# Patient Record
Sex: Male | Born: 1985 | Race: White | Hispanic: No | Marital: Married | Smoking: Never smoker
Health system: Southern US, Community
[De-identification: ages and names within clinical notes are randomized; demographics above are authoritative.]

## PROBLEM LIST (undated history)

## (undated) DIAGNOSIS — Z809 Family history of malignant neoplasm, unspecified: Secondary | ICD-10-CM

## (undated) DIAGNOSIS — J45909 Unspecified asthma, uncomplicated: Secondary | ICD-10-CM

## (undated) HISTORY — DX: Family history of malignant neoplasm, unspecified: Z80.9

## (undated) HISTORY — PX: OTHER SURGICAL HISTORY: SHX169

---

## 2005-10-23 ENCOUNTER — Emergency Department (HOSPITAL_COMMUNITY): Admission: EM | Admit: 2005-10-23 | Discharge: 2005-10-23 | Payer: Self-pay | Admitting: Emergency Medicine

## 2017-04-27 ENCOUNTER — Institutional Professional Consult (permissible substitution): Payer: Self-pay | Admitting: Pulmonary Disease

## 2017-04-27 NOTE — Progress Notes (Deleted)
   Synopsis: Referred in  for ***  Subjective:   PATIENT ID: Brian Werner GENDER: male DOB: 11-08-1985, MRN: [id number]   HPI  No chief complaint on file.   ***  No past medical history on file.   No family history on file.   Social History   Socioeconomic History  . Marital status: Single    Spouse name: Not on file  . Number of children: Not on file  . Years of education: Not on file  . Highest education level: Not on file  Social Needs  . Financial resource strain: Not on file  . Food insecurity - worry: Not on file  . Food insecurity - inability: Not on file  . Transportation needs - medical: Not on file  . Transportation needs - non-medical: Not on file  Occupational History  . Not on file  Tobacco Use  . Smoking status: Not on file  Substance and Sexual Activity  . Alcohol use: Not on file  . Drug use: Not on file  . Sexual activity: Not on file  Other Topics Concern  . Not on file  Social History Narrative  . Not on file     Allergies not on file   No outpatient medications prior to visit.   No facility-administered medications prior to visit.     ROS    Objective:  Physical Exam   There were no vitals filed for this visit.  ***  CBC No results found for: WBC, RBC, HGB, HCT, PLT, MCV, MCH, MCHC, RDW, LYMPHSABS, MONOABS, EOSABS, BASOSABS   Chest imaging:  PFT:  Labs:  Path:  Echo:  Heart Catheterization:       Assessment & Plan:   No diagnosis found.  Discussion: ***   No current outpatient medications on file.

## 2017-05-21 ENCOUNTER — Encounter: Payer: Self-pay | Admitting: Pulmonary Disease

## 2017-05-21 ENCOUNTER — Ambulatory Visit (HOSPITAL_BASED_OUTPATIENT_CLINIC_OR_DEPARTMENT_OTHER)
Admission: RE | Admit: 2017-05-21 | Discharge: 2017-05-21 | Disposition: A | Discharge status: Home / Self Care | Payer: Non-veteran care | Source: Ambulatory Visit | Attending: Pulmonary Disease | Admitting: Pulmonary Disease

## 2017-05-21 ENCOUNTER — Ambulatory Visit (INDEPENDENT_AMBULATORY_CARE_PROVIDER_SITE_OTHER): Payer: Non-veteran care | Admitting: Pulmonary Disease

## 2017-05-21 VITALS — BP 124/82 | HR 80 | Ht 72.0 in | Wt 175.0 lb

## 2017-05-21 DIAGNOSIS — J4550 Severe persistent asthma, uncomplicated: Secondary | ICD-10-CM

## 2017-05-21 MED ORDER — FORMOTEROL FUMARATE 12 MCG IN CAPS: 12.0000 ug | ORAL_CAPSULE | Freq: Two times a day (BID) | RESPIRATORY_TRACT | 12 refills | Status: DC

## 2017-05-21 MED ORDER — FORMOTEROL FUMARATE 12 MCG IN CAPS: 12.0000 ug | ORAL_CAPSULE | Freq: Two times a day (BID) | RESPIRATORY_TRACT | 2 refills | Status: DC

## 2017-05-21 MED ORDER — FLUTICASONE PROPIONATE HFA 44 MCG/ACT IN AERO: 2.0000 | INHALATION_SPRAY | Freq: Two times a day (BID) | RESPIRATORY_TRACT | 12 refills | Status: DC

## 2017-05-21 MED ORDER — FLUTICASONE PROPIONATE HFA 44 MCG/ACT IN AERO: 2.0000 | INHALATION_SPRAY | Freq: Two times a day (BID) | RESPIRATORY_TRACT | 2 refills | Status: DC

## 2017-05-21 NOTE — Progress Notes (Deleted)
   Synopsis: Referred in  for ***  Subjective:   PATIENT ID: Brian Werner GENDER: male DOB: 02/25/1986, MRN: [id number]   HPI  No chief complaint on file.   ***  No past medical history on file.   No family history on file.   Social History   Socioeconomic History  . Marital status: Single    Spouse name: Not on file  . Number of children: Not on file  . Years of education: Not on file  . Highest education level: Not on file  Social Needs  . Financial resource strain: Not on file  . Food insecurity - worry: Not on file  . Food insecurity - inability: Not on file  . Transportation needs - medical: Not on file  . Transportation needs - non-medical: Not on file  Occupational History  . Not on file  Tobacco Use  . Smoking status: Not on file  Substance and Sexual Activity  . Alcohol use: Not on file  . Drug use: Not on file  . Sexual activity: Not on file  Other Topics Concern  . Not on file  Social History Narrative  . Not on file     Allergies not on file   No outpatient medications prior to visit.   No facility-administered medications prior to visit.     ROS    Objective:  Physical Exam   There were no vitals filed for this visit.  ***  CBC No results found for: WBC, RBC, HGB, HCT, PLT, MCV, MCH, MCHC, RDW, LYMPHSABS, MONOABS, EOSABS, BASOSABS   Chest imaging:  PFT:  Labs:  Path:  Echo:  Heart Catheterization:       Assessment & Plan:   No diagnosis found.  Discussion: ***   No current outpatient medications on file.  

## 2017-05-21 NOTE — Progress Notes (Signed)
Subjective:    Patient ID: Brian Werner, male    DOB: 29-Sep-1985, 31 y.o.   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  HPI Chief Complaint  Patient presents with  . pulm consult    [facility] [place].   Brian Werner developed respiratory symptoms and was diagnosed with asthma about 2-3 years ago.  He has never smoked cigarettes.  He never had childhood asthma.  He was diagnosed while in the military deployed in [place] but his symptoms [place] [address] [place]  He was hospitalized 3-4 times requiring high doses of steroids.  Prior to his hospitalization he had been managed by [facility] with various forms of Advair, Flovent, and other inhaled corticosteroids but still had persistent symptoms.  In fact his symptoms worsen to the point to where he needed frequent hospitalizations in [place].  Finally, he was prescribed a [place] combination inhaler of formoterol and fluticasone.   He takes the [place] combination inhaler 2-4 puffs per day and he feels well.  He may have chest tightness, wheezing and mild dyspnea if there dust in the environment, or if his seasonal allergies are worse, but in general his symptoms are very well controlled while taking this medicine.  [place] he has tried to change to Symbicort and he had recurrence of symptoms.  If he has a cold he feels that his symptoms.  On a good day he feels that he has no symptoms.    He has a He keeps an albuterol inhaler but only really needs it if he if he doesn't have the combination inhaler from [place] available to him.  He says that he was given a very large supply when he left [place] and now he is nearing the end of that supply.  While taking the medicine he may need to use albuterol 1-2 to specifically he will use it for chest tightness wheezing or shortness of breath  He  will have some associated itchy eyes times.  But no sinus congestion or postnasal drip.   No GERD symptoms.  He doesn't smoke and he has never smoked.  He works in a Restaurant manager, fast foodclerical environment.  Home is older, but doesn't have any mold, mildew or dust that he is aware of.  They have two dogs outside.   Exercise goes OK as long as he has the medicines.   History reviewed. No pertinent past medical history.   Family History  Problem Relation Age of Onset  . Diabetes Father      Social History   Socioeconomic History  . Marital status: Single    Spouse name: Not on file  . Number of children: Not on file  . Years of education: Not on file  . Highest education level: Not on file  Social Needs  . Financial resource strain: Not on file  . Food insecurity - worry: Not on file  . Food insecurity - inability: Not on file  . Transportation needs - medical: Not on file  . Transportation needs - non-medical: Not on file  Occupational History  . Not on file  Tobacco Use  . Smoking status: Never Smoker  . Smokeless tobacco: Never Used  Substance and Sexual Activity  . Alcohol use: No    Frequency: Never  . Drug use: No  . Sexual activity: Not on  file  Other Topics Concern  . Not on file  Social History Narrative  . Not on file     Not on File   Outpatient Medications Prior to Visit  Medication Sig Dispense Refill  . montelukast (SINGULAIR) 10 MG tablet Take 10 mg by mouth at bedtime.     No facility-administered medications prior to visit.       Review of Systems  Constitutional: Negative for fever and unexpected weight change.  HENT: Positive for sinus pressure and sneezing. Negative for congestion, dental problem, ear pain, nosebleeds, postnasal drip, rhinorrhea, sore throat and trouble swallowing.   Eyes: Negative for redness and itching.  Respiratory: Positive for cough, chest tightness, shortness of breath and wheezing.   Cardiovascular: Negative for palpitations and  leg swelling.  Gastrointestinal: Negative for nausea and vomiting.  Genitourinary: Negative for dysuria.  Musculoskeletal: Negative for joint swelling.  Skin: Negative for rash.  Allergic/Immunologic: Positive for environmental allergies. Negative for food allergies and immunocompromised state.  Neurological: Negative for headaches.  Hematological: Does not bruise/bleed easily.  Psychiatric/Behavioral: Negative for dysphoric mood. The patient is not nervous/anxious.        Objective:   Physical Exam  Vitals:   05/21/17 1408  BP: 124/82  Pulse: 80  SpO2: 97%  Weight: 175 lb (79.4 kg)  Height: 6' (1.829 m)   RA  Gen: well appearing, no acute distress HENT: NCAT, OP clear, neck supple without masses Eyes: PERRL, EOMi Lymph: no cervical lymphadenopathy PULM: CTA B CV: RRR, no mgr, no JVD GI: BS+, soft, nontender, no hsm Derm: no rash or skin breakdown MSK: normal bulk and tone Neuro: A&Ox4, CN II-XII intact, strength 5/5 in all 4 extremities Psyche: normal mood and affect       Assessment & Plan:   Severe persistent asthma without complication  Discussion: Brian Werner has severe persistent asthma and has been hospitalized for the same in the past.  The only medication combination that has worked for him has been inhaled fluticasone and formoterol.  He has tried Advair in various forms and other medications including Symbicort and none of these have given him good control.  At this time we need to continue formoterol and fluticasone but the only way to do that is to use separate inhalers.  I told him at length today that he cannot take the formoterol independently because of its associated increased risk of death and asthmatics.  As long as he is taking it with an inhaled corticosteroid (fluticasone) however it should be fine.  Plan:  Severe persistent asthma: We will prescribe Foradil take this twice a day, only if you are able to take an inhaled corticosteroid like  fluticasone Take inhaled fluticasone 2 puffs twice per day Call me if your symptoms are worsening while taking these medicines We will get a chest x-ray We will get a blood test called a CBC to look at your serum eosinophil count We will check a blood test called a serum IgE because this test is associated with allergic asthma We will check a spirometry test today  We will plan on seeing him back in 3-4 months or sooner if needed  > 1 hour spent on this visit.   Current Outpatient Medications:  .  montelukast (SINGULAIR) 10 MG tablet, Take 10 mg by mouth at bedtime., Disp: , Rfl:

## 2017-05-21 NOTE — Patient Instructions (Addendum)
Severe persistent asthma: We will prescribe Foradil take this twice a day, only if you are able to take an inhaled corticosteroid like fluticasone Take inhaled fluticasone 2 puffs twice per day Call me if your symptoms are worsening while taking these medicines We will get a chest x-ray We will get a blood test called a CBC to look at your serum eosinophil count We will check a blood test called a serum IgE because this test is associated with allergic asthma We will check a spirometry test today  We will plan on seeing him back in 3-4 months or sooner if needed

## 2017-05-24 LAB — CBC WITH DIFFERENTIAL
BASOS ABS: 0.1 10*3/uL (ref 0.0–0.2)
Basos: 1 %
EOS (ABSOLUTE): 0.2 10*3/uL (ref 0.0–0.4)
Eos: 3 %
Hematocrit: 48.4 % (ref 37.5–51.0)
Hemoglobin: 16.6 g/dL (ref 13.0–17.7)
IMMATURE GRANS (ABS): 0 10*3/uL (ref 0.0–0.1)
Immature Granulocytes: 0 %
LYMPHS: 30 %
Lymphocytes Absolute: 2 10*3/uL (ref 0.7–3.1)
MCH: 29.6 pg (ref 26.6–33.0)
MCHC: 34.3 g/dL (ref 31.5–35.7)
MCV: 86 fL (ref 79–97)
MONOS ABS: 0.5 10*3/uL (ref 0.1–0.9)
Monocytes: 7 %
NEUTROS PCT: 59 %
Neutrophils Absolute: 3.8 10*3/uL (ref 1.4–7.0)
RBC: 5.6 x10E6/uL (ref 4.14–5.80)
RDW: 13.5 % (ref 12.3–15.4)
WBC: 6.5 10*3/uL (ref 3.4–10.8)

## 2017-05-24 LAB — IGE: IGE (IMMUNOGLOBULIN E), SERUM: 45 [IU]/mL (ref 0–100)

## 2017-05-28 ENCOUNTER — Telehealth: Payer: Self-pay | Admitting: Pulmonary Disease

## 2017-05-28 MED ORDER — FORMOTEROL FUMARATE 12 MCG IN CAPS: 12.0000 ug | ORAL_CAPSULE | Freq: Two times a day (BID) | RESPIRATORY_TRACT | 2 refills | Status: DC

## 2017-05-28 MED ORDER — FLUTICASONE PROPIONATE HFA 44 MCG/ACT IN AERO: 2.0000 | INHALATION_SPRAY | Freq: Two times a day (BID) | RESPIRATORY_TRACT | 2 refills | Status: DC

## 2017-05-28 MED ORDER — MONTELUKAST SODIUM 10 MG PO TABS: 10.0000 mg | ORAL_TABLET | Freq: Every day | ORAL | 2 refills | Status: AC

## 2017-05-28 MED ORDER — MONTELUKAST SODIUM 10 MG PO TABS: 10.0000 mg | ORAL_TABLET | Freq: Every day | ORAL | 2 refills | Status: DC

## 2017-05-28 MED ORDER — FORMOTEROL FUMARATE 12 MCG IN CAPS
12.0000 ug | ORAL_CAPSULE | Freq: Two times a day (BID) | RESPIRATORY_TRACT | 2 refills | Status: DC
Start: 2017-05-28 — End: 2017-05-28

## 2017-05-28 NOTE — Telephone Encounter (Signed)
Spoke with pt, he states [facility] [place]. I re-sent the Rxs to the pharmacy. Nothing further is needed.   Fax [phone]

## 2017-05-29 ENCOUNTER — Telehealth: Payer: Self-pay | Admitting: Pulmonary Disease

## 2017-05-29 NOTE — Telephone Encounter (Signed)
Called spoke with patient, apologized for the inconvenience Per the 12.27.18 phone note, Delaney Meigsamara [place] [facility] [place] [facility] Asked pt to verify fax number > he stated that he does not have the fax number and has been trying to obtain it all day [facility] the [facility] > was #[phone] in line on hold with the [facility]  Was able to locate the same Rx's Delaney Meigsamara faxed yesterday  Pt will come to the office to pick these up on Monday 12.31.18 Rx's placed in envelope and put in brown accordian folder up front  Nothing further needed at this time; will sign off

## 2017-06-03 NOTE — Telephone Encounter (Signed)
[facility] Prescriptions saved in Dr. Kirstie MirzaNadels paper box

## 2017-06-03 NOTE — Telephone Encounter (Signed)
Brian MerlLisa Werner Fax#[phone]

## 2017-06-05 ENCOUNTER — Telehealth: Payer: Self-pay | Admitting: Pulmonary Disease

## 2017-06-05 NOTE — Telephone Encounter (Signed)
         I tried to call Misty StanleyLisa but the [facility] stated I would have to have her last name in order to locate her since they have many clinics. I tried the pharmacy, and there was no Misty StanleyLisa. I called pt to see if he knew what she was needing so we can help. He did receive a e-mail stating the [facility] cannot provide the Foradil any longer and she suggested pt get an RX for [facility] instead of the two separate inhalers. Fluticasone and formoterol has always worked for him and wants BQ opinion on the [facility].  He states if it is not an option, he would have to call the [facility] or have us write a letter stating this is what he needs. Please advise.

## 2017-06-05 NOTE — Telephone Encounter (Signed)
He absolutely has to be on formoterol and inhaled fluticasone.  So we will need to appeal.  He has a long history of failing multiple other inhalers.

## 2017-06-08 NOTE — Telephone Encounter (Signed)
Left message for patient

## 2017-06-08 NOTE — Telephone Encounter (Signed)
[facility], states that it is not a matter of appealing a noncovered inhaler, the problem is that the [facility] no longer provides Foradil for veterans.    BQ please advise if an alternative to the foradil can be suggested, or if we need to try and find a pharmacy that will cover the foradil for pt.  Thanks!

## 2017-06-08 NOTE — Telephone Encounter (Signed)
He will have to get it from another pharmacy then.

## 2017-06-09 NOTE — Telephone Encounter (Signed)
Spoke with pt to make aware of recs. States that he does not want to get a med from another pharmacy because he will have to pay for it if he does.  Pt states that he will talk to Gould[facility]isa at the [facility] and see if this will be covered at another pharmacy since the [facility] is not providing the Foradil, and will call us back if he wants it sent to another pharmacy.  Will await call back.

## 2017-06-09 NOTE — Telephone Encounter (Signed)
Spoke with patient Pt stated he needs hard copies of both inhaler prescriptions and singular prescription individual sheets of paper Pt stated needs a letter from BQ stating these medications work for the patient other medications failed  Pt advised once he receives these scripts on separate sheets of paper and letter from BQ, the VA would be able to provide these medications for the pt from other pharmacy but through the [facility] therefore pt will not have to pay.  BQ please advise

## 2017-06-09 NOTE — Telephone Encounter (Signed)
Pt is calling back [phone]

## 2017-06-10 NOTE — Telephone Encounter (Signed)
OK  Please indicate the following in the letter:  Brian Werner has severe persistent asthma and has been hospitalized for the same in the past.  The only medication combination that has worked for him has been inhaled fluticasone and formoterol.  He has tried Advair in various forms and other medications including Symbicort and none of these have given him good control.  At this time we need to continue formoterol and fluticasone but the only way to do that is to use separate inhalers.

## 2017-06-10 NOTE — Telephone Encounter (Signed)
Will go ahead and type letter. Attempted to call patient to make him aware.

## 2017-06-12 NOTE — Telephone Encounter (Signed)
LMTCB X2

## 2017-06-15 NOTE — Telephone Encounter (Signed)
Attempted to contact pt. There was no answer and I could not leave a message due to his voicemail being full. Will try back. 

## 2017-06-17 NOTE — Telephone Encounter (Signed)
lmtcb

## 2017-06-17 NOTE — Telephone Encounter (Signed)
ATC pt, no answer. Left message for pt to call back.  

## 2017-06-17 NOTE — Telephone Encounter (Signed)
Pt is calling back [phone] 

## 2017-06-18 NOTE — Telephone Encounter (Signed)
lmtcb x2 for pt. 

## 2017-06-19 NOTE — Telephone Encounter (Signed)
lmtcb X3 for pt. 

## 2017-06-22 NOTE — Telephone Encounter (Signed)
Will close this message since the patient has not returned our call.

## 2017-06-25 ENCOUNTER — Telehealth: Payer: Self-pay | Admitting: Pulmonary Disease

## 2017-06-25 NOTE — Telephone Encounter (Signed)
Pt stated needs a letter from BQ stating these medications work for the patient other medications failed  Pt is wanting to see if BQ could do this letter for him and he will come by and pick this up as he can take [facility] to [facility].  BQ please advise. Thanks

## 2017-06-26 NOTE — Telephone Encounter (Signed)
I'm happy to do this, but I am pretty sure we did it a month ago. If not then let me know and I can dictate again.  Will cc Morrie SheldonAshley.

## 2017-06-26 NOTE — Telephone Encounter (Signed)
Left message for patient to call back  

## 2017-06-29 NOTE — Telephone Encounter (Signed)
Per BQ from phone note from 06/05/2017:  OK  Please indicate the following in the letter:  Brian Werner has severe persistent asthma and has been hospitalized for the same in the past. The only medication combination that has worked for him has been inhaled fluticasone and formoterol. He has tried Advair in various forms and other medications including Symbicort and none of these have given him good control. At this time we need to continue formoterol and fluticasone but the only way to do that is to use separate inhalers.    -------------------------------------   Called and spoke to pt. Informed him that BQ wrote letter. Pt states he was able to resolve the issue without letter. Pt states he saw another [facility] doctor and informed her of the issue and she was able to call the pharmacy and prescribe what the inhalers the pt needed. However, pt states they currently do not carry the formoterol inhaler at this time so they provided him with the nebulizer for the next 2 months until they can provide the pt with the formoterol inhaler. Pt denied needing anything further. Will sign off.

## 2017-09-04 ENCOUNTER — Encounter: Payer: Self-pay | Admitting: Pulmonary Disease

## 2017-09-04 ENCOUNTER — Ambulatory Visit (INDEPENDENT_AMBULATORY_CARE_PROVIDER_SITE_OTHER): Payer: Non-veteran care | Admitting: Pulmonary Disease

## 2017-09-04 VITALS — BP 128/74 | HR 76 | Ht 72.0 in | Wt 173.6 lb

## 2017-09-04 DIAGNOSIS — J4550 Severe persistent asthma, uncomplicated: Secondary | ICD-10-CM

## 2017-09-04 NOTE — Progress Notes (Signed)
Subjective:    Patient ID: Brian Werner, male    DOB: [birth year], 32 y.o.   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  His symptoms have been well controlled on formoterol and fluticasone.   HPI Chief Complaint  Patient presents with  . Follow-up    pt states he is doing well, is no longer on foradil but has been switched to perforomist neb by [place].    Brian Werner has been using his formoterol nebulized twice a day and Flovent twice a day which he gets from the [place].  He says that he now has new insurance. He has needed to use his albuterol inhaler once or twice, once when he was cleaning out his basement garage.  He said that once back in January he had some difficulty and needed a prednisone taper.     PMH: Asthma  Family History  Problem Relation Age of Onset  . Diabetes Father         Review of Systems  Constitutional: Negative for fever and unexpected weight change.  HENT: Positive for sinus pressure and sneezing. Negative for congestion, dental problem, ear pain, nosebleeds, postnasal drip, rhinorrhea, sore throat and trouble swallowing.   Eyes: Negative for redness and itching.  Respiratory: Positive for cough, chest tightness, shortness of breath and wheezing.   Cardiovascular: Negative for palpitations and leg swelling.  Gastrointestinal: Negative for nausea and vomiting.  Genitourinary: Negative for dysuria.  Musculoskeletal: Negative for joint swelling.  Skin: Negative for rash.  Allergic/Immunologic: Positive for environmental allergies. Negative for food allergies and immunocompromised state.  Neurological: Negative for headaches.  Hematological: Does not bruise/bleed easily.  Psychiatric/Behavioral: Negative for dysphoric mood. The patient is not nervous/anxious.        Objective:   Physical Exam  Vitals:   09/04/17 0917  BP: 128/74  Pulse: 76  SpO2: 99%  Weight:  173 lb 9.6 oz (78.7 kg)  Height: 6' (1.829 m)   RA  Gen: well appearing HENT: OP clear, TM's clear, neck supple PULM: CTA B, normal percussion CV: RRR, no mgr, trace edema GI: BS+, soft, nontender Derm: no cyanosis or rash Psyche: normal mood and affect        Assessment & Plan:   Severe persistent asthma without complication  Discussion: This is been a stable interval for Brian Werner.  He only had one exacerbation in January when he was without formoterol.  He has a clinical syndrome consistent with asthma though not an atopic phenotype.  Unfortunately we were only able to use standard asthma controller medicines for patients in this scenario.  He has proven time after time that using any combination other than fluticasone and formoterol does not work for him and he should never be changed off of this combination.  I have encouraged him to explore options from international pharmacies for this combination as we know there are simpler options available to him rather than the nebulized Perforomist he is using now with HFA Flovent.  As always, we stressed the importance of using these 2 medicines together and never using formoterol alone.  He voiced understanding.  Plan: Severe persistent asthma: Continue using formoterol nebulized and Flovent Flovent must be used when using for Medrol Use albuterol as needed for chest tightness wheezing or shortness of breath From my standpoint it is okay to get this combination of medicines in a combination inhaler from international sources and  I am happy to help you with that  I will plan on seeing you back in 1 year or sooner if needed  19 minutes spent with patient, 25-minute visit   Current Outpatient Medications:  .  albuterol (PROVENTIL HFA;VENTOLIN HFA) 108 (90 Base) MCG/ACT inhaler, Inhale 2 puffs into the lungs every 6 (six) hours as needed for wheezing or shortness of breath., Disp: , Rfl:  .  fluticasone (FLOVENT HFA) 44 MCG/ACT inhaler,  Inhale 2 puffs into the lungs 2 (two) times daily., Disp: 1 Inhaler, Rfl: 2 .  formoterol (PERFOROMIST) 20 MCG/2ML nebulizer solution, Take 20 mcg by nebulization 2 (two) times daily., Disp: , Rfl:  .  montelukast (SINGULAIR) 10 MG tablet, Take 1 tablet (10 mg total) by mouth at bedtime., Disp: 30 tablet, Rfl: 2 .  formoterol (FORADIL) 12 MCG capsule for inhaler, Place 1 capsule (12 mcg total) into inhaler and inhale 2 (two) times daily. (Patient not taking: Reported on 09/04/2017), Disp: 60 capsule, Rfl: 2

## 2017-09-04 NOTE — Patient Instructions (Signed)
Severe persistent asthma: Continue using formoterol nebulized and Flovent Flovent must be used when using for Medrol Use albuterol as needed for chest tightness wheezing or shortness of breath From my standpoint it is okay to get this combination of medicines in a combination inhaler from international sources and I am happy to help you with that  I will plan on seeing you back in 1 year or sooner if needed  19 minutes spent with patient, 25-minute visit

## 2017-12-18 ENCOUNTER — Encounter (HOSPITAL_COMMUNITY): Payer: Self-pay | Admitting: Obstetrics and Gynecology

## 2017-12-18 ENCOUNTER — Other Ambulatory Visit: Payer: Self-pay

## 2017-12-18 ENCOUNTER — Emergency Department (HOSPITAL_COMMUNITY)
Admission: EM | Admit: 2017-12-18 | Discharge: 2017-12-18 | Disposition: A | Discharge status: Home / Self Care | Payer: Non-veteran care | Attending: Emergency Medicine | Admitting: Emergency Medicine

## 2017-12-18 DIAGNOSIS — Y999 Unspecified external cause status: Secondary | ICD-10-CM | POA: Diagnosis not present

## 2017-12-18 DIAGNOSIS — Y93H2 Activity, gardening and landscaping: Secondary | ICD-10-CM | POA: Diagnosis not present

## 2017-12-18 DIAGNOSIS — R0602 Shortness of breath: Secondary | ICD-10-CM | POA: Diagnosis not present

## 2017-12-18 DIAGNOSIS — T63441A Toxic effect of venom of bees, accidental (unintentional), initial encounter: Secondary | ICD-10-CM | POA: Diagnosis not present

## 2017-12-18 DIAGNOSIS — R21 Rash and other nonspecific skin eruption: Secondary | ICD-10-CM | POA: Diagnosis present

## 2017-12-18 DIAGNOSIS — J45909 Unspecified asthma, uncomplicated: Secondary | ICD-10-CM | POA: Insufficient documentation

## 2017-12-18 DIAGNOSIS — Y92017 Garden or yard in single-family (private) house as the place of occurrence of the external cause: Secondary | ICD-10-CM | POA: Diagnosis not present

## 2017-12-18 HISTORY — DX: Unspecified asthma, uncomplicated: J45.909

## 2017-12-18 MED ORDER — PREDNISONE 20 MG PO TABS: 40.0000 mg | ORAL_TABLET | Freq: Every day | ORAL | 0 refills | Status: AC

## 2017-12-18 MED ORDER — PREDNISONE 20 MG PO TABS
40.0000 mg | ORAL_TABLET | Freq: Once | ORAL | Status: AC
  Administered 2017-12-18: 40 mg via ORAL
  Filled 2017-12-18: qty 2

## 2017-12-18 NOTE — ED Triage Notes (Signed)
Pt reports he was working out in the yard and was stung by hornets possibly. Pt reports stings on arms and legs Pt reports the stings burn and pt reports that he is an asthmatic and he is having trouble getting his breathing Pt takes singulair, albuterol, and atflovent and has had no relief. Pt is at 99% on RA and speaking in complete sentences.

## 2017-12-18 NOTE — ED Provider Notes (Signed)
[facility] Note   CSN: [id number] Arrival date & time: 12/18/17  2123     History   Chief Complaint Chief Complaint  Patient presents with  . Allergic Reaction    HPI Brian Werner is a 32 y.o. male who presents today for evaluation of bee stings.  He reports that around 4 PM he was working in the yard when he was stung once on his right arm and multiple times on his right feet.  He reports that he has tried his home breathing treatments without significant relief.  He reports that he has never had any reactions to stings before.  No interventions tried prior to arrival.    HPI  Past Medical History:  Diagnosis Date  . Asthma     There are no active problems to display for this patient.   Past Surgical History:  Procedure Laterality Date  . wisdom tooth removal          Home Medications    Prior to Admission medications   Medication Sig Start Date End Date Taking? Authorizing Provider  albuterol (PROVENTIL HFA;VENTOLIN HFA) 108 (90 Base) MCG/ACT inhaler Inhale 2 puffs into the lungs every 6 (six) hours as needed for wheezing or shortness of breath.    [provider]  fluticasone (FLOVENT HFA) 44 MCG/ACT inhaler Inhale 2 puffs into the lungs 2 (two) times daily. 05/28/17 05/28/18  Lupita Leash, MD  formoterol (FORADIL) 12 MCG capsule for inhaler Place 1 capsule (12 mcg total) into inhaler and inhale 2 (two) times daily. Patient not taking: Reported on 09/04/2017 05/28/17 05/28/18  Lupita Leash, MD  formoterol (PERFOROMIST) 20 MCG/2ML nebulizer solution Take 20 mcg by nebulization 2 (two) times daily.    [provider]  montelukast (SINGULAIR) 10 MG tablet Take 1 tablet (10 mg total) by mouth at bedtime. 05/28/17   Lupita Leash, MD  predniSONE (DELTASONE) 20 MG tablet Take 2 tablets (40 mg total) by mouth daily for 3 days. 12/18/17 12/21/17  Cristina Gong, PA-C    Family History Family  History  Problem Relation Age of Onset  . Diabetes Father     Social History Social History   Tobacco Use  . Smoking status: Never Smoker  . Smokeless tobacco: Never Used  Substance Use Topics  . Alcohol use: No    Frequency: Never  . Drug use: No     Allergies   Patient has no allergy information on record.   Review of Systems Review of Systems  Constitutional: Negative for chills and fever.  HENT: Negative for ear pain and sore throat.   Eyes: Negative for pain and visual disturbance.  Respiratory: Positive for chest tightness. Negative for cough and shortness of breath.   Cardiovascular: Negative for chest pain and palpitations.  Gastrointestinal: Negative for abdominal pain and vomiting.  Genitourinary: Negative for dysuria and hematuria.  Musculoskeletal: Negative for arthralgias and back pain.  Skin: Negative for color change and rash.  Neurological: Negative for seizures and syncope.  All other systems reviewed and are negative.    Physical Exam Updated Vital Signs BP (!) 154/108 (BP Location: Left Arm)   Pulse (!) 108   Temp (!) 97.3 F (36.3 C) (Oral)   Resp 18   SpO2 99%   Physical Exam  Constitutional: He appears well-developed and well-nourished.  HENT:  Head: Normocephalic and atraumatic.  Eyes: Conjunctivae are normal.  Neck: Neck supple.  Cardiovascular: Normal rate, regular rhythm and  normal heart sounds.  No murmur heard. Pulmonary/Chest: Effort normal and breath sounds normal. No stridor. No respiratory distress. He has no wheezes. He has no rales.  Abdominal: Soft. There is no tenderness.  Musculoskeletal: He exhibits no edema.  Neurological: He is alert.  Skin: Skin is warm and dry.  3cm area of erythema on left mid upper arm.    Multiple ~1cm areas of redness on left ankle.  No stingers present in either site.   Psychiatric: He has a normal mood and affect.  Nursing note and vitals reviewed.    ED Treatments / Results   Labs (all labs ordered are listed, but only abnormal results are displayed) Labs Reviewed - No data to display  EKG None  Radiology No results found.  Procedures Procedures (including critical care time)  Medications Ordered in ED Medications  predniSONE (DELTASONE) tablet 40 mg (has no administration in time range)     Initial Impression / Assessment and Plan / ED Course  I have reviewed the triage vital signs and the nursing notes.  Pertinent labs & imaging results that were available during my care of the patient were reviewed by me and considered in my medical decision making (see chart for details).    Patient presents today for evaluation of possible allergic reaction.  He was stung by wasps approximately 6 hours prior to my evaluation.  He has not had any worsening.  He denies any nausea, vomiting, or diarrhea.  Rash is limited to area around the stings without generalized urticaria.  He does report that he feels like his chest is tight.  He reports that he has tried his at home breathing treatments and now is here primarily for steroids.  Discussed with him the role of Benadryl and other medications and treating allergic reactions.  Patient is requesting he be given p.o. prednisone here and then discharged with a prescription for prednisone.  He was instructed to take Benadryl at home.  Discussed that medically would like to monitor him and give him a breathing treatment here, however patient declined, stating he wanted steroids and to go home.  OTC care for the stings.  Discharged home after return precautions discussed.    Final Clinical Impressions(s) / ED Diagnoses   Final diagnoses:  Bee sting, accidental or unintentional, initial encounter    ED Discharge Orders        Ordered    predniSONE (DELTASONE) 20 MG tablet  Daily     12/18/17 2206       Cristina GongHammond, Olliver Boyadjian W, Cordelia Poche-C 12/18/17 2221    Maia PlanLong, Joshua G, MD 12/18/17 [phone]

## 2017-12-18 NOTE — Discharge Instructions (Signed)
Please start the prednisone tomorrow night.   For the next 24 hours please take 1 pill (25 mg) of benadryl every 6 hours if needed.  If you have mild symptoms such as itching, mild nausea you may take a second benadryl.  After 24 hours please start taking zyrtec.  While the box tells you to take one pill every 24 hours I want you to take one pill every 12 hours for the next few days.  This will make you sleepy and you should not drive, operate heavy machinery or perform any task where if you were to fall asleep or make a slow decision it could cause harm to you or someone else.   You have also been given a prescription for 3 days of steroids.  Please take your first dose about 24 hours after you were given steroids in the emergency room.  Steroids may give you the feeling of being hot, increased appetite, and extra energy.

## 2018-01-01 ENCOUNTER — Ambulatory Visit (HOSPITAL_COMMUNITY)
Admission: RE | Admit: 2018-01-01 | Discharge: 2018-01-01 | Disposition: A | Discharge status: Home / Self Care | Payer: No Typology Code available for payment source | Attending: Psychiatry | Admitting: Psychiatry

## 2018-01-01 DIAGNOSIS — J45909 Unspecified asthma, uncomplicated: Secondary | ICD-10-CM | POA: Diagnosis not present

## 2018-01-01 DIAGNOSIS — Z833 Family history of diabetes mellitus: Secondary | ICD-10-CM | POA: Diagnosis not present

## 2018-01-01 DIAGNOSIS — F4310 Post-traumatic stress disorder, unspecified: Secondary | ICD-10-CM | POA: Diagnosis present

## 2018-01-01 NOTE — BH Assessment (Signed)
Assessment Note  Brian Werner is an 32 y.o. male [facility]. [facility] is a [facility] and [facility] but was having anxiety attacks and worsening anxiety and depression. Pt also reports lack of sleep for a week. Pt has PTSD and has had worsening night mares and his grandmother passed away yesterday. Pt reports he was hoping for a single Zanax to assist until he could get to [facility] and possibly talk therapy to assist him tonight. Provider explained we couldn't prescribe meds unless inpatient and stated he could go to the ER and us observe him overnight and possibly give medication then but pt has children at home and needs to assist with grandmother's funeral. This clinician discussed ideas for the evening to include mindfulness, over the counter sleep aids and thought distraction ideas for the evening. Pt reported he felt better just talking it out with someone and felt good about the plan fpr the evening. This clinician discussed with pt that he felt unsafe tonight with worsening anxiety to call 911 or go to nearest ER. Pt agreed. Pt denies SI currently. Pt denies any history of suicide attempts and denies history of self-mutilation. Pt denies homicidal thoughts or physical aggression. Pt denies having access to firearms. Pt denies having any legal problems at this time. Pt denies hallucinations. Pt does not appear to be responding to internal stimuli and exhibits no delusional thought. Pt's reality testing appears to be intact. Pt denies any current or past substance abuse problems. Pt does not appear to be intoxicated or in withdrawal at this time. Pt lives with wife and children. Pt has outpatient services through [facility] and will follow up with them first thing Monday AM.   Pt is dressed in street clothes alert, oriented x4 with normal speech and normal motor behavior. Eye contact is good and Pt is pleasant. Pt's mood is depressed and affect is anxious. Thought process is coherent  and relevant. Pt's insight is good and judgement is unimpaired. There is no indication Pt is currently responding to internal stimuli or experiencing delusional thought content. Pt was cooperative throughout assessment.     Diagnosis: F43.10 Posttraumatic stress disorder   Past Medical History:  Past Medical History:  Diagnosis Date  . Asthma     Past Surgical History:  Procedure Laterality Date  . wisdom tooth removal      Family History:  Family History  Problem Relation Age of Onset  . Diabetes Father     Social History:  reports that he has never smoked. He has never used smokeless tobacco. He reports that he does not drink alcohol or use drugs.  Additional Social History:  Alcohol / Drug Use Pain Medications: See MAR Prescriptions: See MAR Over the Counter: See MAR History of alcohol / drug use?: No history of alcohol / drug abuse  CIWA:   COWS:    Allergies: Not on File  Home Medications:  (Not in a hospital admission)  OB/GYN Status:  No LMP for male patient.  General Assessment Data [facility] TTS Assessment: In system Is this a Tele or Face-to-Face Assessment?: Face-to-Face Is this an Initial Assessment or a Re-assessment for this encounter?: Initial Assessment Marital status: Married Living Arrangements: Children, Spouse/significant other Can pt return to current living arrangement?: Yes Admission Status: Voluntary Is patient capable of signing voluntary admission?: Yes Referral Source: Self/Family/Friend Insurance type: [employer]  Medical Screening Exam [facility]) Medical Exam completed: Yes  Crisis Care Plan Living Arrangements: Children, Spouse/significant other [facility]  Education Status Is patient currently in school?: No Is the patient employed, unemployed or receiving disability?: Employed  Risk to self with the past 6 months Suicidal Ideation: No Has  patient been a risk to self within the past 6 months prior to admission? : No Suicidal Intent: No Has patient had any suicidal intent within the past 6 months prior to admission? : No Is patient at risk for suicide?: No Suicidal Plan?: No Has patient had any suicidal plan within the past 6 months prior to admission? : No Access to Means: No What has been your use of drugs/alcohol within the last 12 months?: None Previous Attempts/Gestures: No Intentional Self Injurious Behavior: None Family Suicide History: No Recent stressful life event(s): Conflict (Comment), Loss (Comment), Trauma (Comment) Persecutory voices/beliefs?: No Depression: Yes Depression Symptoms: Insomnia, Fatigue Substance abuse history and/or treatment for substance abuse?: No Suicide prevention information given to non-admitted patients: Not applicable  Risk to Others within the past 6 months Homicidal Ideation: No Does patient have any lifetime risk of violence toward others beyond the six months prior to admission? : No Thoughts of Harm to Others: No Current Homicidal Intent: No Current Homicidal Plan: No Access to Homicidal Means: No History of harm to others?: No Assessment of Violence: None Noted Does patient have access to weapons?: No Criminal Charges Pending?: No Does patient have a court date: No Is patient on probation?: No  Psychosis Hallucinations: None noted Delusions: None noted  Mental Status Report Appearance/Hygiene: Unremarkable Eye Contact: Good Motor Activity: Freedom of movement Speech: Logical/coherent Level of Consciousness: Alert Mood: Depressed, Anxious Affect: Appropriate to circumstance Anxiety Level: Moderate Thought Processes: Coherent, Relevant Judgement: Unimpaired Orientation: Person, Place, Time, Situation, Appropriate for developmental age Obsessive Compulsive Thoughts/Behaviors: None  Cognitive Functioning Concentration: Normal Memory: Recent Intact Is patient  IDD: No Is patient DD?: No Insight: Good Impulse Control: Good Appetite: Good Have you had any weight changes? : No Change Sleep: Decreased Total Hours of Sleep: 4 Vegetative Symptoms: None  ADLScreening [facility] Assessment Services) Patient's cognitive ability adequate to safely complete daily activities?: Yes Patient able to express need for assistance with ADLs?: Yes Independently performs ADLs?: Yes (appropriate for developmental age)  Prior Inpatient Therapy Prior Inpatient Therapy: Yes Prior Therapy Dates: 2018 Prior Therapy Facilty/Provider(s): [place] Reason for Treatment: Depression/Anxiety  Prior Outpatient Therapy Prior Outpatient Therapy: Yes Prior Therapy Dates: Ongoing [facility] Reason for Treatment: Depression/ANxiety/PTSD Does patient have an ACCT team?: No Does patient have Intensive In-House Services?  : No Does patient have [facility] services? : No Does patient have P4CC services?: No  ADL Screening (condition at time of admission) Patient's cognitive ability adequate to safely complete daily activities?: Yes Is the patient deaf or have difficulty hearing?: No Does the patient have difficulty seeing, even when wearing glasses/contacts?: No Does the patient have difficulty concentrating, remembering, or making decisions?: No Patient able to express need for assistance with ADLs?: Yes Does the patient have difficulty dressing or bathing?: No Independently performs ADLs?: Yes (appropriate for developmental age) Does the patient have difficulty walking or climbing stairs?: No Weakness of Legs: None Weakness of Arms/Hands: None  Home Assistive Devices/Equipment Home Assistive Devices/Equipment: None  Therapy Consults (therapy consults require a physician order) PT Evaluation Needed: No OT Evalulation Needed: No SLP Evaluation Needed: No Abuse/Neglect Assessment (Assessment to be complete while patient is alone) Abuse/Neglect  Assessment Can Be Completed:  Yes Physical Abuse: Denies Verbal Abuse: Yes, past (Comment) Sexual Abuse: Denies Exploitation of patient/patient's resources: Denies Self-Neglect: Denies Values / Beliefs Cultural Requests During Hospitalization: None Spiritual Requests During Hospitalization: None Consults Spiritual Care Consult Needed: No Social Work Consult Needed: No Merchant navy officer (For Healthcare) Does Patient Have a Medical Advance Directive?: No Would patient like information on creating a medical advance directive?: No - Patient declined    Additional Information 1:1 In Past 12 Months?: No CIRT Risk: No Elopement Risk: No Does patient have medical clearance?: No     Disposition:  Disposition Initial Assessment Completed for this Encounter: Yes Disposition of Patient: Discharge Mode of transportation if patient is discharged?: Car Patient referred to: Outpatient clinic referral(Made plan for over the counter sleep meds) Per Donell Sievert, NP recommends discharge.  On Site Evaluation by:   Reviewed with Physician:    Danae Orleans, MA, [facility] 01/01/2018 9:52 PM

## 2018-01-02 NOTE — H&P (Signed)
Behavioral Health Medical Screening Exam  Brian Werner is an 32 y.o. male.[facility]. [facility] is a [facility] and [facility] but was having anxiety attacks and worsening anxiety and depression. Pt also reports lack of sleep for a week. Pt has PTSD and has had worsening night mares and his grandmother passed away yesterday. Pt reports he was hoping for a single Zanax to assist until he could get to the [facility] and possibly talk therapy to assist him tonight. Provider explained we couldn't prescribe meds unless inpatient and stated he could go to the ER and us observe him overnight and possibly give medication then but pt has children at home and needs to assist with grandmother's funeral. This clinician discussed ideas for the evening to include mindfulness, over the counter sleep aids and thought distraction ideas for the evening. Pt reported he felt better just talking it out with someone and felt good about the plan fpr the evening.     Total Time spent with patient: 15 minutes  Psychiatric Specialty Exam: Physical Exam  Constitutional: He is oriented to person, place, and time. He appears well-developed and well-nourished. No distress.  HENT:  Head: Normocephalic.  Eyes: Pupils are equal, round, and reactive to light.  Respiratory: Effort normal and breath sounds normal. No respiratory distress.  Neurological: He is alert and oriented to person, place, and time. No cranial nerve deficit.  Skin: Skin is warm and dry. He is not diaphoretic.  Psychiatric: His speech is normal. Judgment normal. His mood appears anxious. He is agitated. Cognition and memory are normal. He expresses suicidal ideation. He expresses no homicidal ideation. He expresses suicidal plans. He expresses no homicidal plans.    Review of Systems  Constitutional: Negative for chills, diaphoresis, fever, malaise/fatigue and weight loss.  Psychiatric/Behavioral: Positive for depression. Negative for  hallucinations, substance abuse and suicidal ideas. The patient is nervous/anxious and has insomnia.     There were no vitals taken for this visit.There is no height or weight on file to calculate BMI.  General Appearance: Casual  Eye Contact:  Fair  Speech:  Clear and Coherent  Volume:  Normal  Mood:  Anxious and Depressed  Affect:  Congruent  Thought Process:  Goal Directed  Orientation:  Full (Time, Place, and Person)  Thought Content:  Logical  Suicidal Thoughts:  No  Homicidal Thoughts:  No  Memory:  Immediate;   Fair  Judgement:  Fair  Insight:  Fair  Psychomotor Activity:  Normal  Concentration: Concentration: Fair  Recall:  [employer]Fair  Fund of Knowledge:Fair  Language: Fair  Akathisia:  Negative  Handed:  Right  AIMS (if indicated):     Assets:  Desire for Improvement  Sleep:       Musculoskeletal: Strength & Muscle Tone: within normal limits Gait & Station: normal Patient leans: N/A  There were no vitals taken for this visit.  Recommendations:  Based on my evaluation the patient does not appear to have an emergency medical condition.  Kerry HoughSpencer E Simon, PA-C 01/02/2018, 4:37 AM

## 2018-06-23 ENCOUNTER — Encounter: Payer: Self-pay | Admitting: Gastroenterology

## 2018-07-27 ENCOUNTER — Encounter: Payer: Self-pay | Admitting: Gastroenterology

## 2018-07-27 ENCOUNTER — Ambulatory Visit (INDEPENDENT_AMBULATORY_CARE_PROVIDER_SITE_OTHER): Payer: No Typology Code available for payment source | Admitting: Gastroenterology

## 2018-07-27 VITALS — BP 110/74 | HR 85 | Ht 72.0 in | Wt 185.0 lb

## 2018-07-27 DIAGNOSIS — Z809 Family history of malignant neoplasm, unspecified: Secondary | ICD-10-CM

## 2018-07-27 NOTE — Patient Instructions (Addendum)
If you are age 34 or older, your body mass index should be between 23-30. Your Body mass index is 25.09 kg/m. If this is out of the aforementioned range listed, please consider follow up with your Primary Care Provider.  If you are age 23 or younger, your body mass index should be between 19-25. Your Body mass index is 25.09 kg/m. If this is out of the aformentioned range listed, please consider follow up with your Primary Care Provider.   You have been scheduled for an endoscopy and colonoscopy. Please follow the written instructions given to you at your visit today. Please pick up your prep supplies at the pharmacy within the next 1-3 days. If you use inhalers (even only as needed), please bring them with you on the day of your procedure. Your physician has requested that you go to www.startemmi.com and enter the access code given to you at your visit today. This web site gives a general overview about your procedure. However, you should still follow specific instructions given to you by our office regarding your preparation for the procedure.  We are giving you a Plenvu sample today to use for your prep.  Thank you for entrusting me with your care and for choosing [employer], Dr. Ileene Patrick

## 2018-07-27 NOTE — Progress Notes (Signed)
HPI :  33 y/o male with a history of asthma, family history of Muir-Torre syndrome, referred by the [facility] for consideration of endoscopic evaluation given family history of Isidoro Donning syndrome.  He reports his father was diagnosed with a sebaceous carcinoma by dermatology and was evaluated which led to a workup and ultimate diagnosis of Muir-Torre syndrome. He was 65 at time of diagnosis.  He denies any known family history of colon cancer, gastric cancer, renal cancer, gynecologic malignancy. He reports no skin changes or personal history of sebaceous neoplasms. He denies any problems with his bowel function, has regular bowel movements without any blood. No nausea, no vomiting, no weight loss. His history is remarkable for reactive airway disease which he developed during [place]. He is on multiple medications for this which seems to be controlling symptoms pretty well.  He has never had endoscopic evaluation before. He generally feels well without complaints. He asked questions about Muir-Torre syndrome. He states he is artery been referred to genetic testing by the [place] however was referred to [facility] has been told he has a nine-month wait for evaluation   Past Medical History:  Diagnosis Date  . Asthma   . Family history of Muir-Torre syndrome    father     Past Surgical History:  Procedure Laterality Date  . wisdom tooth removal     Family History  Problem Relation Age of Onset  . Diabetes Father   . Colon cancer Neg Hx    Social History   Tobacco Use  . Smoking status: Never Smoker  . Smokeless tobacco: Never Used  Substance Use Topics  . Alcohol use: No    Frequency: Never  . Drug use: No   Current Outpatient Medications  Medication Sig Dispense Refill  . albuterol (PROVENTIL HFA;VENTOLIN HFA) 108 (90 Base) MCG/ACT inhaler Inhale 2 puffs into the lungs every 6 (six) hours as needed for wheezing or shortness of breath.    . formoterol  (PERFOROMIST) 20 MCG/2ML nebulizer solution Take 20 mcg by nebulization 2 (two) times daily.    . montelukast (SINGULAIR) 10 MG tablet Take 1 tablet (10 mg total) by mouth at bedtime. 30 tablet 2  . fluticasone (FLOVENT HFA) 44 MCG/ACT inhaler Inhale 2 puffs into the lungs 2 (two) times daily. 1 Inhaler 2  . formoterol (FORADIL) 12 MCG capsule for inhaler Place 1 capsule (12 mcg total) into inhaler and inhale 2 (two) times daily. (Patient not taking: Reported on 09/04/2017) 60 capsule 2   No current facility-administered medications for this visit.    No Known Allergies   Review of Systems: All systems reviewed and negative except where noted in HPI.    No results found.  Physical Exam: BP 110/74   Pulse 85   Ht 6' (1.829 m)   Wt 185 lb (83.9 kg)   BMI 25.09 kg/m  Constitutional: Pleasant,well-developed, male in no acute distress. HEENT: Normocephalic and atraumatic. Conjunctivae are normal. No scleral icterus. Neck supple.  Cardiovascular: Normal rate, regular rhythm.  Pulmonary/chest: Effort normal and breath sounds normal. No wheezing, rales or rhonchi. Abdominal: Soft, nondistended, nontender.  There are no masses palpable. No hepatomegaly. Extremities: no edema Lymphadenopathy: No cervical adenopathy noted. Neurological: Alert and oriented to person place and time. Skin: Skin is warm and dry. No rashes noted. Psychiatric: Normal mood and affect. Behavior is normal.   ASSESSMENT AND PLAN: 33 year old male referred here for new patient consultation regarding the following:  Family history of Muir-Torre  syndrome - history as above, recently diagnosed in his father. Recommendations reviewed for this syndrome include at-risk family members (first-degree relatives) should undergo a preventive cancer screening program similar to that indicated for patients with Lynch syndrome. For his age this includes the following right now: skin exam, colonoscopy, EGD, UA with cytology. He  should also be referred to a Dentist for testing. I discussed risks and benefits of endoscopy and colonoscopy with him, he wants to proceed with these exams. He states he get his skin exam and urinalysis done at the [place]. I offered to refer him to [facility] for which he can be seen [facility] however [facility], he will contact them to see if that is doable given he has been told just wait another 9 months to be seen at [facility]. He agreed with the plan.   Ileene Patrick, MD  [facility]

## 2018-08-25 ENCOUNTER — Encounter: Payer: Self-pay | Admitting: Gastroenterology

## 2018-10-30 IMAGING — CR DG CHEST 2V
2 series · 2 of 2 positions shown · non-contrast
Comparison: None.

CLINICAL DATA: Severe persistent asthma without complication.

EXAM:
CHEST  2 VIEW

[w chest pa]
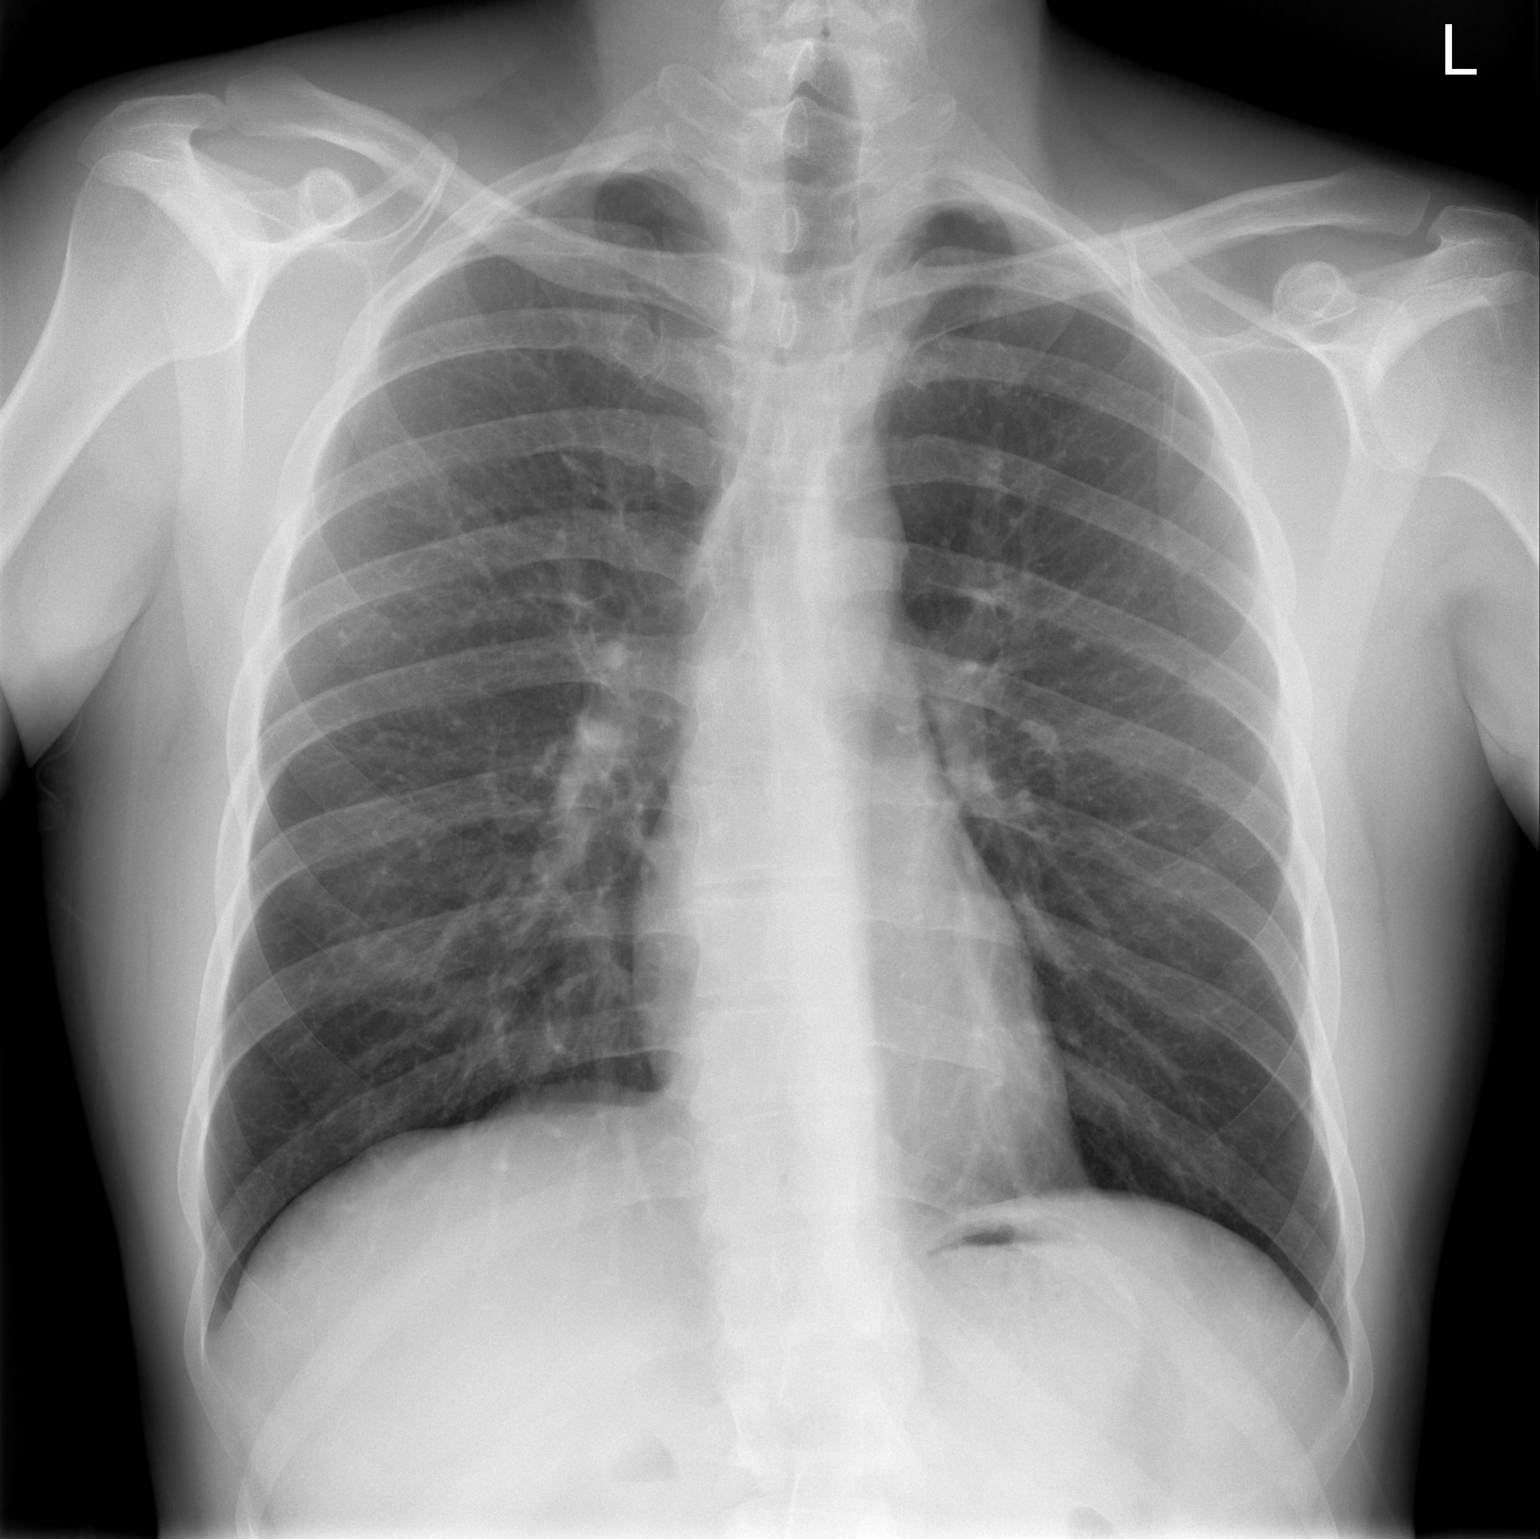

[w chest lat]
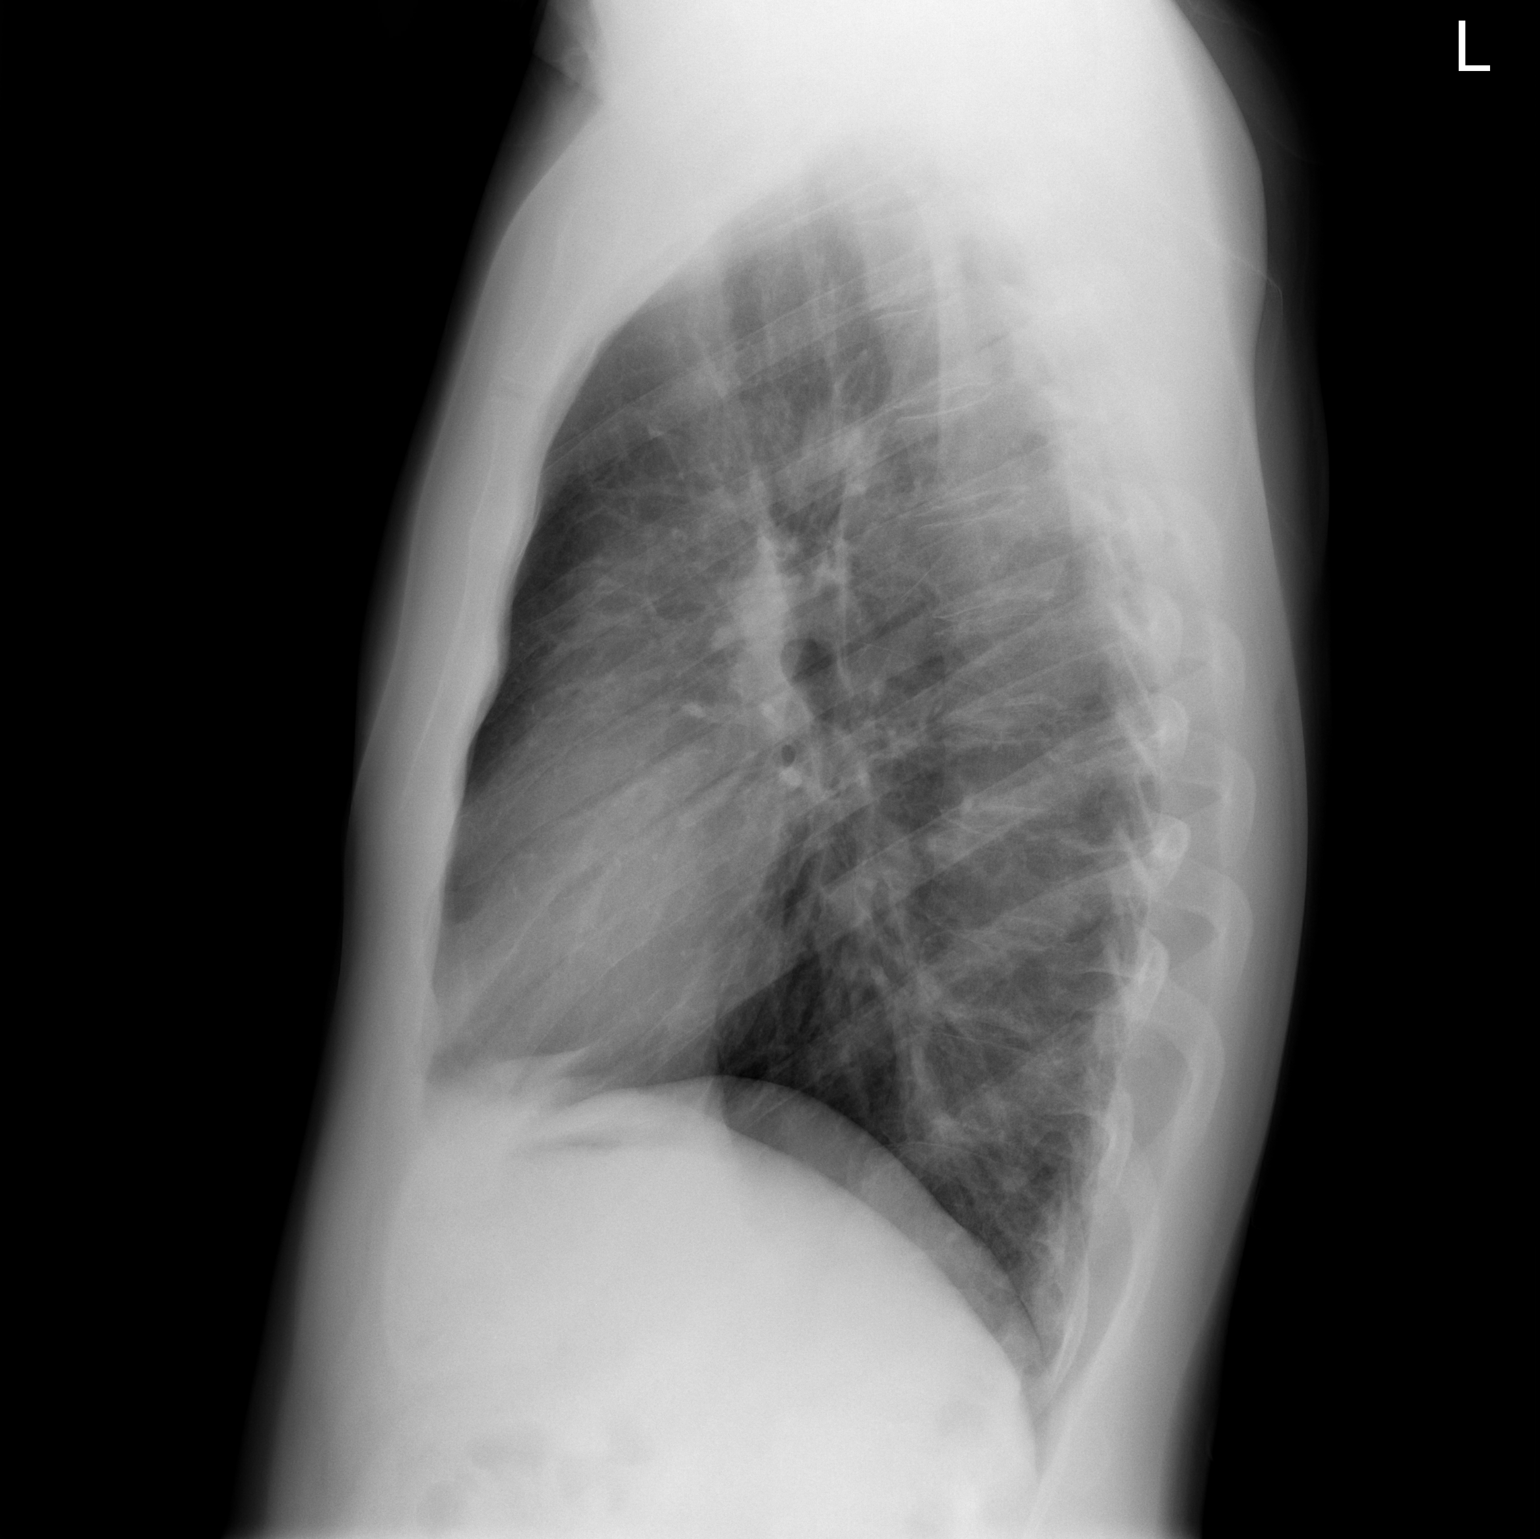

[2 of 2 positions shown; findings below may reference images not displayed]

FINDINGS: Borderline hyperinflation and central bronchial thickening The
cardiomediastinal contours are normal. Pulmonary vasculature is
normal. No consolidation, pleural effusion, or pneumothorax. No
acute osseous abnormalities are seen.
IMPRESSION: Borderline hyperinflation and mild central bronchial thickening
likely secondary to asthma.

## 2019-02-08 ENCOUNTER — Ambulatory Visit (INDEPENDENT_AMBULATORY_CARE_PROVIDER_SITE_OTHER): Payer: No Typology Code available for payment source | Admitting: Pulmonary Disease

## 2019-02-08 ENCOUNTER — Other Ambulatory Visit: Payer: Self-pay

## 2019-02-08 ENCOUNTER — Encounter: Payer: Self-pay | Admitting: Pulmonary Disease

## 2019-02-08 VITALS — BP 116/68 | HR 74 | Temp 97.6°F | Ht 72.0 in | Wt 180.0 lb

## 2019-02-08 DIAGNOSIS — J4540 Moderate persistent asthma, uncomplicated: Secondary | ICD-10-CM

## 2019-02-08 NOTE — Patient Instructions (Signed)
I am glad you are doing well with regard to your breathing Continue the performance nebulizer and Flovent inhaler Continue Singulair Follow-up in 1 year Please give [place] a call sooner if there is any change in your symptoms.

## 2019-02-08 NOTE — Progress Notes (Signed)
Brian Werner    [id number]    12-05-1985  Primary Care Physician:Patient, No Pcp Per  Referring Physician: No referring provider defined for this encounter.  Chief complaint: Follow-up for asthma  HPI: 33 year old with history of asthma He was in [employer] and reports exposure to fumes, inhaled toxins [place] and developed dyspnea shortly thereafter.  Is maintained for many years and albuterol as needed with no improvement in symptoms He was finally started on inhaled steroids and LABA with relief of symptoms Currently maintained on formoterol nebulizer twice daily and fluticasone inhaler Has an albuterol inhaler which he uses about once a day.  No nighttime symptoms  ACQ 7 02/08/19-1.6  Pets: Has 4 dogs Occupation: Was in [employer].  Currently works as a Dance movement psychotherapistorganizer for [employer] Exposures: Reports exposures while in [employer].  No current ongoing exposures.  No mold, hot tub, Jacuzzi Smoking history: Remote smoking history and high school Travel history: No significant travel history Relevant family history: Grandfather died of lung cancer.  He was a smoker.  Outpatient Encounter Medications as of 02/08/2019  Medication Sig  . albuterol (PROVENTIL HFA;VENTOLIN HFA) 108 (90 Base) MCG/ACT inhaler Inhale 2 puffs into the lungs every 6 (six) hours as needed for wheezing or shortness of breath.  . fluticasone (FLOVENT HFA) 44 MCG/ACT inhaler Inhale 2 puffs into the lungs 2 (two) times daily.  . formoterol (PERFOROMIST) 20 MCG/2ML nebulizer solution Take 20 mcg by nebulization 2 (two) times daily.  . montelukast (SINGULAIR) 10 MG tablet Take 1 tablet (10 mg total) by mouth at bedtime.  . [DISCONTINUED] formoterol (FORADIL) 12 MCG capsule for inhaler Place 1 capsule (12 mcg total) into inhaler and inhale 2 (two) times daily. (Patient not taking: Reported on 09/04/2017)   No facility-administered encounter medications on file as of 02/08/2019.      Allergies as of 02/08/2019  . (No Known Allergies)    Past Medical History:  Diagnosis Date  . Asthma   . Family history of Muir-Torre syndrome    father    Past Surgical History:  Procedure Laterality Date  . wisdom tooth removal      Family History  Problem Relation Age of Onset  . Diabetes Father   . Colon cancer Neg Hx     Social History   Socioeconomic History  . Marital status: Married    Spouse name: Not on file  . Number of children: Not on file  . Years of education: Not on file  . Highest education level: Not on file  Occupational History  . Not on file  Social Needs  . Financial resource strain: Not on file  . Food insecurity    Worry: Not on file    Inability: Not on file  . Transportation needs    Medical: Not on file    Non-medical: Not on file  Tobacco Use  . Smoking status: Never Smoker  . Smokeless tobacco: Never Used  Substance and Sexual Activity  . Alcohol use: No    Frequency: Never  . Drug use: No  . Sexual activity: Yes  Lifestyle  . Physical activity    Days per week: Not on file    Minutes per session: Not on file  . Stress: Not on file  Relationships  . Social Musicianconnections    Talks on phone: Not on file    Gets together: Not on file    Attends  religious service: Not on file    Active member of club or organization: Not on file    Attends meetings of clubs or organizations: Not on file    Relationship status: Not on file  . Intimate partner violence    Fear of current or ex partner: Not on file    Emotionally abused: Not on file    Physically abused: Not on file    Forced sexual activity: Not on file  Other Topics Concern  . Not on file  Social History Narrative  . Not on file    Review of systems: Review of Systems  Constitutional: Negative for fever and chills.  HENT: Negative.   Eyes: Negative for blurred vision.  Respiratory: as per HPI  Cardiovascular: Negative for chest pain and palpitations.   Gastrointestinal: Negative for vomiting, diarrhea, blood per rectum. Genitourinary: Negative for dysuria, urgency, frequency and hematuria.  Musculoskeletal: Negative for myalgias, back pain and joint pain.  Skin: Negative for itching and rash.  Neurological: Negative for dizziness, tremors, focal weakness, seizures and loss of consciousness.  Endo/Heme/Allergies: Negative for environmental allergies.  Psychiatric/Behavioral: Negative for depression, suicidal ideas and hallucinations.  All other systems reviewed and are negative.  Physical Exam: Blood pressure 116/68, pulse 74, temperature 97.6 F (36.4 C), temperature source Temporal, height 6' (1.829 m), weight 180 lb (81.6 kg), SpO2 97 %. Gen:      No acute distress HEENT:  EOMI, sclera anicteric Neck:     No masses; no thyromegaly Lungs:    Clear to auscultation bilaterally; normal respiratory effort CV:         Regular rate and rhythm; no murmurs Abd:      + bowel sounds; soft, non-tender; no palpable masses, no distension Ext:    No edema; adequate peripheral perfusion Skin:      Warm and dry; no rash Neuro: alert and oriented x 3 Psych: normal mood and affect  Data Reviewed: Imaging: Chest x-ray 05/21/2017- mild hyperinflation,Mild bronchial wall thickening.  I have reviewed the images personally  PFTs: 05/21/2017 FVC 5.4 [94%], FEV1 4.4 [95%], F/F 81 Normal test  Labs: CBC 05/21/2017-WBC 6.5, eos 3%, absolute eosinophil count 195 IgE 05/21/2017-45  Assessment:  Moderate persistent asthma Stable on current regimen of Flovent, formoterol and Singulair Follow-up in 1 year.  Plan/Recommendations: - Continue current management  Marshell Garfinkel MD [facility] 02/08/2019, 9:20 AM  CC: No ref. provider found

## 2020-02-13 ENCOUNTER — Ambulatory Visit: Payer: Non-veteran care | Admitting: Pulmonary Disease

## 2020-03-06 ENCOUNTER — Ambulatory Visit: Payer: Non-veteran care | Admitting: Pulmonary Disease

## 2022-01-29 ENCOUNTER — Ambulatory Visit (INDEPENDENT_AMBULATORY_CARE_PROVIDER_SITE_OTHER): Payer: No Typology Code available for payment source | Admitting: Pulmonary Disease

## 2022-01-29 ENCOUNTER — Encounter: Payer: Self-pay | Admitting: Pulmonary Disease

## 2022-01-29 VITALS — BP 118/62 | HR 71 | Temp 98.3°F | Ht 72.0 in | Wt 191.6 lb

## 2022-01-29 DIAGNOSIS — J4540 Moderate persistent asthma, uncomplicated: Secondary | ICD-10-CM | POA: Diagnosis not present

## 2022-01-29 MED ORDER — BUDESONIDE-FORMOTEROL FUMARATE 160-4.5 MCG/ACT IN AERO: 2.0000 | INHALATION_SPRAY | Freq: Two times a day (BID) | RESPIRATORY_TRACT | 11 refills | Status: DC

## 2022-01-29 NOTE — Progress Notes (Signed)
         Brian Werner    [id number]    1985/12/05  Primary Care Physician:Patient, No Pcp Per  Referring Physician: No referring provider defined for this encounter.  Chief complaint: Follow-up for asthma  HPI: 36 year old with history of asthma He was in [employer] and reports exposure to fumes, inhaled toxins while in the [place] and developed dyspnea shortly thereafter.  Is maintained for many years and albuterol as needed with no improvement in symptoms He was finally started on inhaled steroids and LABA with relief of symptoms Currently maintained on formoterol nebulizer twice daily and fluticasone inhaler Has an albuterol inhaler which he uses about once a day.  No nighttime symptoms   Pets: Has 4 dogs Occupation: Was in [employer].  Currently works as a Dance movement psychotherapist for [employer] Exposures: Reports exposures while in [employer].  No current ongoing exposures.  No mold, hot tub, Jacuzzi Smoking history: Remote smoking history and high school Travel history: No significant travel history Relevant family history: Grandfather died of lung cancer.  He was a smoker.  Interim history: Here in pulmonary clinic after gap of 3 years.  His breathing is stable on formoterol and fluticasone inhalers.  He would like a prescription for a combined inhaler as he is tired of using 2 different inhalers Continues on nebulizers as needed, Singulair and albuterol as needed  Outpatient Encounter Medications as of 01/29/2022  Medication Sig   albuterol (PROVENTIL HFA;VENTOLIN HFA) 108 (90 Base) MCG/ACT inhaler Inhale 2 puffs into the lungs every 6 (six) hours as needed for wheezing or shortness of breath.   fluticasone (FLOVENT HFA) 44 MCG/ACT inhaler Inhale 2 puffs into the lungs 2 (two) times daily.   formoterol (PERFOROMIST) 20 MCG/2ML nebulizer solution Take 20 mcg by nebulization 2 (two) times daily.   montelukast (SINGULAIR) 10 MG tablet Take 1 tablet (10 mg total) by mouth at  bedtime.   No facility-administered encounter medications on file as of 01/29/2022.   Physical Exam: Blood pressure 118/62, pulse 71, temperature 98.3 F (36.8 C), temperature source Oral, height 6' (1.829 m), weight 191 lb 9.6 oz (86.9 kg), SpO2 98 %. Gen:      No acute distress HEENT:  EOMI, sclera anicteric Neck:     No masses; no thyromegaly Lungs:    Clear to auscultation bilaterally; normal respiratory effort\ CV:         Regular rate and rhythm; no murmurs Abd:      + bowel sounds; soft, non-tender; no palpable masses, no distension Ext:    No edema; adequate peripheral perfusion Skin:      Warm and dry; no rash Neuro: alert and oriented x 3 Psych: normal mood and affect   Data Reviewed: Imaging: Chest x-ray 05/21/2017- mild hyperinflation,Mild bronchial wall thickening.  I have reviewed the images personally  PFTs: 05/21/2017 FVC 5.4 [94%], FEV1 4.4 [95%], F/F 81 Normal test  Labs: CBC 05/21/2017-WBC 6.5, eos 3%, absolute eosinophil count 195 IgE 05/21/2017-45  Assessment:  Moderate persistent asthma Stable on current regimen of Flovent, formoterol and Singulair He would like to combine inhalers and we will prescribe Symbicort instead of Flovent and formoterol nebs.  [facility] Continue Singulair  Plan/Recommendations: - Prescribe Symbicort - Continue Singulair  Chilton Greathouse MD [place] [facility] 01/29/2022, 2:31 PM  CC: No ref. provider found

## 2022-01-29 NOTE — Patient Instructions (Signed)
Glad you are doing well with your breathing We will prescribe new inhaler called Symbicort 160/4.5.  Take 2 puffs twice daily.  He can take additional puffs as needed for rescue breaths This will take the place of the formoterol and fluticasone inhalers Continue Singulair Follow-up in 6 months

## 2022-03-07 ENCOUNTER — Telehealth: Payer: Self-pay | Admitting: Pulmonary Disease

## 2022-03-07 NOTE — Telephone Encounter (Signed)
Please advise on the inhaler change?

## 2022-03-10 MED ORDER — FLUTICASONE-SALMETEROL 250-50 MCG/ACT IN AEPB: 1.0000 | INHALATION_SPRAY | Freq: Two times a day (BID) | RESPIRATORY_TRACT | 5 refills | Status: DC

## 2022-03-10 NOTE — Telephone Encounter (Signed)
Okay to change inhaler to Wixela 250/50. Thanks

## 2022-03-10 NOTE — Telephone Encounter (Signed)
Rx for Grant Ruts has been sent to pharmacy for pt. Nothing further needed.

## 2022-03-19 ENCOUNTER — Telehealth: Payer: Self-pay | Admitting: Pulmonary Disease

## 2022-03-19 NOTE — Telephone Encounter (Signed)
Dr. Vaughan Browner, please advise on this for pt which inhaler we should send for pt.

## 2022-03-19 NOTE — Telephone Encounter (Signed)
See telephone note from 10/6.  We sent in an order for Wixela 250/50.  Is this not covered as well:  Can try Dulera 200/5

## 2022-03-19 NOTE — Telephone Encounter (Signed)
Cant tolerate powder disc form. Please advise   [facility] [place]

## 2022-03-20 ENCOUNTER — Telehealth: Payer: Self-pay | Admitting: Pulmonary Disease

## 2022-03-20 MED ORDER — BUDESONIDE-FORMOTEROL FUMARATE 160-4.5 MCG/ACT IN AERO: 2.0000 | INHALATION_SPRAY | Freq: Two times a day (BID) | RESPIRATORY_TRACT | 6 refills | Status: AC

## 2022-03-20 NOTE — Telephone Encounter (Signed)
Called and spoke with patient. Patient stated that the reason the symbicort wasn't covered is because he needed a note to the pharmacy stating that he cannot tolerate the powder form. Patient stated that he wanted the symbicort sent in to the pharmacy with a note saying he can't tolerate the powder form.   Rx has been sent to the patients pharmacy. I sent a note to the pharmacy that he could not tolerate the powder form.   Nothing further needed.

## 2022-03-21 NOTE — Telephone Encounter (Signed)
Attempted to call Brian Werner at [facility] but received a recording stating that the call could not be completed as dialed.  Will try to call again later.

## 2022-03-24 NOTE — Telephone Encounter (Signed)
Tried calling and the line rang multiple times and then msg stated call can not be completed  Closing encounter

## 2022-08-08 ENCOUNTER — Encounter: Payer: Self-pay | Admitting: Pulmonary Disease

## 2022-08-08 ENCOUNTER — Ambulatory Visit (INDEPENDENT_AMBULATORY_CARE_PROVIDER_SITE_OTHER): Payer: No Typology Code available for payment source | Admitting: Pulmonary Disease

## 2022-08-08 VITALS — BP 118/60 | HR 75 | Temp 98.0°F | Ht 72.0 in | Wt 193.0 lb

## 2022-08-08 DIAGNOSIS — J4540 Moderate persistent asthma, uncomplicated: Secondary | ICD-10-CM

## 2022-08-08 NOTE — Patient Instructions (Signed)
I am glad you are doing well with your breathing Continue the inhalers as prescribed Follow-up in 1 year

## 2022-08-08 NOTE — Progress Notes (Signed)
         Brian Werner    [id number]    12/14/1985  Primary Care Physician:Patient, No Pcp Per  Referring Physician: Augustine Radar, MD [facility] [address] [place],  [place] [address]  Chief complaint: Follow-up for asthma  HPI: 37 y.o. with history of asthma He was in [employer] and reports exposure to fumes, inhaled toxins while in the [place] and developed dyspnea shortly thereafter.  Is maintained for many years and albuterol as needed with no improvement in symptoms He was finally started on inhaled steroids and LABA with relief of symptoms Currently maintained on formoterol nebulizer twice daily and fluticasone inhaler Has an albuterol inhaler which he uses about once a day.  No nighttime symptoms  Pets: Has 4 dogs Occupation: Was in [employer].  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  No current ongoing exposures.  No mold, hot tub, Jacuzzi Smoking history: Remote smoking history and high school Travel history: No significant travel history Relevant family history: Grandfather died of lung cancer.  He was a smoker.  Interim history: At last visit his formoterol and fluticasone inhalers were combined into Symbicort Continues on Singulair and albuterol as needed  Outpatient Encounter Medications as of 08/08/2022  Medication Sig   albuterol (PROVENTIL HFA;VENTOLIN HFA) 108 (90 Base) MCG/ACT inhaler Inhale 2 puffs into the lungs every 6 (six) hours as needed for wheezing or shortness of breath.   budesonide-formoterol (SYMBICORT) 160-4.5 MCG/ACT inhaler Inhale 2 puffs into the lungs 2 (two) times daily.   montelukast (SINGULAIR) 10 MG tablet Take 1 tablet (10 mg total) by mouth at bedtime.   SYNTHROID 50 MCG tablet Take 50 mcg by mouth daily before breakfast.   [DISCONTINUED] fluticasone-salmeterol (WIXELA INHUB) 250-50 MCG/ACT AEPB Inhale 1 puff into the lungs  in the morning and at bedtime.   No facility-administered encounter medications on file as of 08/08/2022.   Physical Exam: Blood pressure 118/60, pulse 75, temperature 98 F (36.7 C), temperature source Oral, height 6' (1.829 m), weight 193 lb (87.5 kg), SpO2 99 %. Gen:      No acute distress HEENT:  EOMI, sclera anicteric Neck:     No masses; no thyromegaly Lungs:    Clear to auscultation bilaterally; normal respiratory effort CV:         Regular rate and rhythm; no murmurs Abd:      + bowel sounds; soft, non-tender; no palpable masses, no distension Ext:    No edema; adequate peripheral perfusion Skin:      Warm and dry; no rash Neuro: alert and oriented x 3 Psych: normal mood and affect   Data Reviewed: Imaging: Chest x-ray 05/21/2017- mild hyperinflation,Mild bronchial wall thickening.  I have reviewed the images personally  PFTs: 05/21/2017 FVC 5.4 [94%], FEV1 4.4 [95%], F/F 81 Normal test  ACT score 08/08/2022 - 24  Labs: CBC 05/21/2017-WBC 6.5, eos 3%, absolute eosinophil count 195 IgE 05/21/2017-45  Assessment:  Moderate persistent asthma Stable on current regimen of Symbicort and Singulair with good control of symptoms He uses over-the-counter antihistamine during pollen season   Plan/Recommendations: - Continue Symbicort, Singulair  Marshell Garfinkel MD [place] [facility] 08/08/2022, 1:20 PM  CC: Augustine Radar, MD

## 2023-09-29 ENCOUNTER — Telehealth: Payer: Self-pay | Admitting: Pulmonary Disease

## 2023-09-29 NOTE — Telephone Encounter (Signed)
 FYI: Sent request to [facility] additional visits, [facility] responded[facility] recently referred internally to pulmonary and refused the care".
